# Patient Record
Sex: Female | Born: 1995 | Race: White | Hispanic: No | Marital: Single | Smoking: Never smoker
Health system: Southern US, Community
[De-identification: ages and names within clinical notes are randomized; demographics above are authoritative.]

## PROBLEM LIST (undated history)

## (undated) DIAGNOSIS — F32A Depression, unspecified: Secondary | ICD-10-CM

## (undated) DIAGNOSIS — T7840XA Allergy, unspecified, initial encounter: Secondary | ICD-10-CM

## (undated) DIAGNOSIS — F329 Major depressive disorder, single episode, unspecified: Secondary | ICD-10-CM

## (undated) DIAGNOSIS — F419 Anxiety disorder, unspecified: Secondary | ICD-10-CM

## (undated) DIAGNOSIS — A749 Chlamydial infection, unspecified: Secondary | ICD-10-CM

## (undated) HISTORY — DX: Chlamydial infection, unspecified: A74.9

## (undated) HISTORY — DX: Anxiety disorder, unspecified: F41.9

## (undated) HISTORY — DX: Allergy, unspecified, initial encounter: T78.40XA

## (undated) HISTORY — DX: Depression, unspecified: F32.A

## (undated) HISTORY — DX: Major depressive disorder, single episode, unspecified: F32.9

## (undated) HISTORY — PX: WISDOM TOOTH EXTRACTION: SHX21

---

## 2013-01-07 ENCOUNTER — Telehealth: Payer: Self-pay | Admitting: Nurse Practitioner

## 2013-01-07 MED ORDER — TERCONAZOLE 0.4 % VA CREA: 1.0000 | TOPICAL_CREAM | Freq: Every day | VAGINAL | Status: DC

## 2013-01-07 NOTE — Telephone Encounter (Signed)
LEFT MESSAGE ON CB# ON VOICE MAIL OF MEDICATION SENT TO [facility]. WILL NEED TO SCHEDULE OV IF NO IMPROVEMENT.

## 2013-01-07 NOTE — Telephone Encounter (Signed)
OK for Terazol Vaginal cream HS X 7.  Then if no better OV.

## 2013-01-07 NOTE — Telephone Encounter (Signed)
SEE NOTE BELOW. PATIENT WORKING 2 JOBS. HAS HISTORY OF YEAST PROBLEM. STATES LAST TIME DIFLUCON HELPED SOME BUT CAME RIGHT BACK AND HAS BEEN DEALING WITH THIS. EXPLAINED AND SUGGESTED OTC MEDS CAN TRY, AVEENO BATH, COTTON CLOTHING. PLEASE ADVISE. STATES ? VAGINAL CREAM OF Rx HELPED LAST TIME. sue

## 2013-01-07 NOTE — Telephone Encounter (Signed)
Called patient at CB# of medication sent to [facility] per Shirlyn Goltz ordered. Unable to leave message at CB# mailbox was full.

## 2013-01-07 NOTE — Telephone Encounter (Signed)
Patient is sure that she has a yeast infection and cannot come to an appointment today. Patient is asking if she could have a prescription called to her pharmacy [facility] [address]

## 2013-01-31 ENCOUNTER — Encounter: Payer: Self-pay | Admitting: Nurse Practitioner

## 2013-01-31 ENCOUNTER — Ambulatory Visit (INDEPENDENT_AMBULATORY_CARE_PROVIDER_SITE_OTHER): Payer: Managed Care, Other (non HMO) | Admitting: Nurse Practitioner

## 2013-01-31 VITALS — BP 104/60 | HR 68 | Resp 12 | Ht 63.5 in | Wt 153.2 lb

## 2013-01-31 DIAGNOSIS — R3915 Urgency of urination: Secondary | ICD-10-CM

## 2013-01-31 DIAGNOSIS — N898 Other specified noninflammatory disorders of vagina: Secondary | ICD-10-CM

## 2013-01-31 DIAGNOSIS — B9689 Other specified bacterial agents as the cause of diseases classified elsewhere: Secondary | ICD-10-CM

## 2013-01-31 DIAGNOSIS — A499 Bacterial infection, unspecified: Secondary | ICD-10-CM

## 2013-01-31 DIAGNOSIS — N760 Acute vaginitis: Secondary | ICD-10-CM

## 2013-01-31 DIAGNOSIS — Z113 Encounter for screening for infections with a predominantly sexual mode of transmission: Secondary | ICD-10-CM

## 2013-01-31 DIAGNOSIS — L293 Anogenital pruritus, unspecified: Secondary | ICD-10-CM

## 2013-01-31 MED ORDER — METRONIDAZOLE 0.75 % VA GEL: 1.0000 | Freq: Every day | VAGINAL | Status: DC

## 2013-01-31 MED ORDER — NYSTATIN-TRIAMCINOLONE 100000-0.1 UNIT/GM-% EX OINT: TOPICAL_OINTMENT | Freq: Two times a day (BID) | CUTANEOUS | Status: AC

## 2013-01-31 MED ORDER — FLUCONAZOLE 150 MG PO TABS: 150.0000 mg | ORAL_TABLET | Freq: Once | ORAL | Status: DC

## 2013-01-31 NOTE — Patient Instructions (Addendum)
Sexually Transmitted Disease  Sexually transmitted disease (STD) refers to any infection that is passed from person to person during sexual activity. This may happen by way of saliva, semen, blood, vaginal mucus, or urine. Common STDs include:   Gonorrhea.   Chlamydia.   Syphilis.   HIV/AIDS.   Genital herpes.   Hepatitis B and C.   Trichomonas.   Human papillomavirus (HPV).   Pubic lice.  CAUSES   An STD may be spread by bacteria, virus, or parasite. A person can get an STD by:   Sexual intercourse with an infected person.   Sharing sex toys with an infected person.   Sharing needles with an infected person.   Having intimate contact with the genitals, mouth, or rectal areas of an infected person.  SYMPTOMS   Some people may not have any symptoms, but they can still pass the infection to others. Different STDs have different symptoms. Symptoms include:   Painful or bloody urination.   Pain in the pelvis, abdomen, vagina, anus, throat, or eyes.   Skin rash, itching, irritation, growths, or sores (lesions). These usually occur in the genital or anal area.   Abnormal vaginal discharge.   Penile discharge in men.   Soft, flesh-colored skin growths in the genital or anal area.   Fever.   Pain or bleeding during sexual intercourse.   Swollen glands in the groin area.   Yellow skin and eyes (jaundice). This is seen with hepatitis.  DIAGNOSIS   To make a diagnosis, your caregiver may:   Take a medical history.   Perform a physical exam.   Take a specimen (culture) to be examined.   Examine a sample of discharge under a microscope.   Perform blood tests.   Perform a Pap test, if this applies.   Perform a colposcopy.   Perform a laparoscopy.  TREATMENT    Chlamydia, gonorrhea, trichomonas, and syphilis can be cured with antibiotic medicine.   Genital herpes, hepatitis, and HIV can be treated, but not cured, with prescribed medicines. The medicines will lessen the symptoms.   Genital warts  from HPV can be treated with medicine or by freezing, burning (electrocautery), or surgery. Warts may come back.   HPV is a virus and cannot be cured with medicine or surgery.However, abnormal areas may be followed very closely by your caregiver and may be removed from the cervix, vagina, or vulva through office procedures or surgery.  If your diagnosis is confirmed, your recent sexual partners need treatment. This is true even if they are symptom-free or have a negative culture or evaluation. They should not have sex until their caregiver says it is okay.  HOME CARE INSTRUCTIONS   All sexual partners should be informed, tested, and treated for all STDs.   Take your antibiotics as directed. Finish them even if you start to feel better.   Only take over-the-counter or prescription medicines for pain, discomfort, or fever as directed by your caregiver.   Rest.   Eat a balanced diet and drink enough fluids to keep your urine clear or pale yellow.   Do not have sex until treatment is completed and you have followed up with your caregiver. STDs should be checked after treatment.   Keep all follow-up appointments, Pap tests, and blood tests as directed by your caregiver.   Only use latex condoms and water-soluble lubricants during sexual activity. Do not use petroleum jelly or oils.   Avoid alcohol and illegal drugs.   Get vaccinated   for HPV and hepatitis. If you have not received these vaccines in the past, talk to your caregiver about whether one or both might be right for you.   Avoid risky sex practices that can break the skin.  The only way to avoid getting an STD is to avoid all sexual activity.Latex condoms and dental dams (for oral sex) will help lessen the risk of getting an STD, but will not completely eliminate the risk.  SEEK MEDICAL CARE IF:    You have a fever.   You have any new or worsening symptoms.  Document Released: 11/04/2002 Document Revised: 11/06/2011 Document Reviewed:  11/11/2010  [employer] Patient Information 2014 [employer].

## 2013-01-31 NOTE — Progress Notes (Signed)
Subjective:     Patient ID: Laura Odom, female   DOB: 06-18-1996, 17 y.o.   MRN: [id number]  HPI 17 yo WF complains of vaginal itching on and off for months.  She is totally frustrated with symptoms and is unhappy with her treatment.  She feels that we are not listening to her and she felt like the last time I treated her she did not get better.  At last visit in February she had yeast vaginitis symptoms  and was treated with Diflucan weekly X 4 which I explained to her was appropriate treatment for chronic yeast.  She then was given triamcinolone and Nystatin  for external itching which she never got filled.  She states our GC Chl testing was negative but [facility] and was positive for chlamydia.  In the interim she has been with 2 partners.  So tried to tell her this could have been a new infection.  She wants retesting today but also treated again for Chlamydia instead of waiting for test results. I explained that she just got treatment mid May and to treat again without culture results was not advised. After treatment for Chlamydia last month has also finished 7 day of Terazol vaginal cream.  Currently using condoms for birth control.  Review of Systems  Constitutional: Negative for fever, chills, appetite change and fatigue.  Respiratory: Negative.   Cardiovascular: Negative.   Gastrointestinal: Negative.  Negative for nausea, vomiting, abdominal pain, diarrhea, constipation and blood in stool.  Genitourinary: Positive for dysuria, frequency, vaginal discharge and dyspareunia. Negative for urgency, hematuria, flank pain, vaginal bleeding, difficulty urinating, genital sores, vaginal pain and pelvic pain.       LMP 01/09/18 with regular flow. Off OCP at present.  She had been noncompliant to OCP at last visit.  Now wants to try POP. Some urinary urgency with maybe slight dysuria for a few days.    Neurological: Negative.   Psychiatric/Behavioral: Positive for  agitation. The patient is nervous/anxious and is hyperactive.        Objective:   Physical Exam  Constitutional: She is oriented to person, place, and time. She appears well-developed and well-nourished.  Cardiovascular: Normal rate.   Pulmonary/Chest: Effort normal.  Abdominal: Soft. She exhibits no distension and no mass. There is no tenderness. There is no rebound and no guarding.  Genitourinary:  No evidence of external rash or lesions.  No signs of chronic yeast. Thin grey vaginal discharge.  Specimen for GC / Chl are obtained. No pelvic pain or bladder pain on bimanual.  Wet prep is + for clue cells of NSS, no yeast on KOH, ph 5.0.  Neurological: She is alert and oriented to person, place, and time.  Skin: Skin is warm and dry.  Psychiatric:  Crying tearful and angry most of visit.       Assessment:     History of chronic yeast per patient Currently BV R/O STD's Need for birth control - currently condoms    Plan:     Metrogel vaginal cream hs X 5 Back up Diflucan if needed X 2 Will call her with STD results - declined pretreatment Since Ms. Laura Odom did AEX in January will have patient to return and consult about birth control options  She has to go to work at 5:00 and with patient's frustration/tearfulness, feel best for her to return on another day.

## 2013-02-03 ENCOUNTER — Telehealth: Payer: Self-pay | Admitting: *Deleted

## 2013-02-03 DIAGNOSIS — N390 Urinary tract infection, site not specified: Secondary | ICD-10-CM

## 2013-02-03 MED ORDER — CIPROFLOXACIN HCL 500 MG PO TABS: 500.0000 mg | ORAL_TABLET | Freq: Two times a day (BID) | ORAL | Status: DC

## 2013-02-03 NOTE — Telephone Encounter (Signed)
Message copied by Osie Bond on Mon Feb 03, 2013  1:45 PM ------      Message from: Ria Comment R      Created: Mon Feb 03, 2013 12:51 PM       Let patient know that urine culture was positive and she needs Cipro 500 mg bid for 1 week. Then TOC in 2 weeks ------

## 2013-02-03 NOTE — Telephone Encounter (Signed)
Cipro 500 mg !po BID #14/0 [facility] per pt's request. pt is aware. TOC on 02/17/2013 @ 3:00, pt aware.

## 2013-02-03 NOTE — Telephone Encounter (Signed)
LVM for pt to return my call in regards to urine culture results.  

## 2013-02-05 LAB — IPS N GONORRHOEA AND CHLAMYDIA BY PCR

## 2013-02-06 ENCOUNTER — Telehealth: Payer: Self-pay | Admitting: *Deleted

## 2013-02-06 ENCOUNTER — Encounter: Payer: Self-pay | Admitting: Nurse Practitioner

## 2013-02-06 NOTE — Telephone Encounter (Signed)
LVM for pt to return my call in regards to std results.

## 2013-02-06 NOTE — Telephone Encounter (Signed)
Message copied by Osie Bond on Thu Feb 06, 2013 10:58 AM ------      Message from: Ria Comment R      Created: Wed Feb 05, 2013  6:11 PM       Let patient know ------

## 2013-02-06 NOTE — Progress Notes (Signed)
Reviewed personally.  M. Suzanne Savir Blanke, MD.  

## 2013-02-06 NOTE — Progress Notes (Signed)
Pt is aware of all negative lab results.

## 2013-02-06 NOTE — Telephone Encounter (Signed)
Pt is aware of all negative lab results.  

## 2013-02-06 NOTE — Telephone Encounter (Signed)
Message copied by Osie Bond on Thu Feb 06, 2013 10:53 AM ------      Message from: Ria Comment R      Created: Wed Feb 05, 2013  6:11 PM       Let patient know ------

## 2013-02-17 ENCOUNTER — Other Ambulatory Visit: Payer: Self-pay

## 2013-02-17 ENCOUNTER — Telehealth: Payer: Self-pay | Admitting: Nurse Practitioner

## 2013-02-17 NOTE — Telephone Encounter (Signed)
Pt missed lab appt for toc this afternoon. Left message to call back if she wish to reschedule.

## 2013-02-19 ENCOUNTER — Telehealth: Payer: Self-pay | Admitting: *Deleted

## 2013-02-19 NOTE — Telephone Encounter (Signed)
Pt was given Rx for Cipro 500 mg 1 po BID #14/0 refills on 02/03/2013. Pt states she picked up Rx from pharmacy on 02/05/2013 and started taking Rx the morning of 02/06/2013. Pt states she lost the last three days worth of Cipro Rx. (pt should have completed Rx on 02/13/2013.) Today is 02/19/2013. Pt states she is having symptoms again and would like another Rx of Cipro called into pharmacy. I told pt office policy states that she has to be seen and antibiotic can't be called into pharmacy without an office visit. Pt insisted that "money's tight right now" and she wouldn't be able to come in office. Please advise.

## 2013-02-19 NOTE — Telephone Encounter (Signed)
Patient needs to come in due to symptoms better and now have started again, especially with a UTI

## 2013-03-26 ENCOUNTER — Ambulatory Visit (INDEPENDENT_AMBULATORY_CARE_PROVIDER_SITE_OTHER): Payer: Managed Care, Other (non HMO) | Admitting: Gynecology

## 2013-03-26 ENCOUNTER — Encounter: Payer: Self-pay | Admitting: Gynecology

## 2013-03-26 VITALS — BP 90/60 | HR 64 | Temp 98.1°F | Ht 63.5 in | Wt 146.0 lb

## 2013-03-26 DIAGNOSIS — N760 Acute vaginitis: Secondary | ICD-10-CM

## 2013-03-26 LAB — POCT WET PREP (WET MOUNT)

## 2013-03-26 NOTE — Progress Notes (Signed)
  Subjective:    Laura Odom is a 17 y.o. female who presents for sexually transmitted disease check. Sexual history reviewed with the patient. STI Exposure: recent partner had +chlamydia pt was treated and had a negative culture afterwards.  Pt states that she is not with the partner that gave her chlamydia, but has a new partner for the last month.  Pt reports using condoms with this new partner.  Previous history of STI chlamydia. Current symptoms vaginal irritation: mild, abnormal bleeding and fever denied.  Pt recently diagnosed with UTI, did not take cipro twice a day as prescribed but took sporadically.  Pt would like test of cure, she left clean catch. Contraception: condoms Menstrual History: OB History   Grav Para Term Preterm Abortions TAB SAB Ect Mult Living                   Patient's last menstrual period was 03/08/2013.    The following portions of the patient's history were reviewed and updated as appropriate: allergies, current medications, past family history, past medical history, past social history, past surgical history and problem list.  Review of Systems Pertinent items are noted in HPI.    Objective:    BP 90/60  Pulse 64  Temp(Src) 98.1 F (36.7 C) (Oral)  Ht 5' 3.5" (1.613 m)  Wt 146 lb (66.225 kg)  BMI 25.45 kg/m2  LMP 03/08/2013 General:   alert, cooperative and appears stated age  Lymph Nodes:   Inguinal adenopathy: absent  Pelvis:  External genitalia: normal general appearance Urinary system: urethral meatus normal Vaginal: normal without tenderness, induration or masses Cervix: normal appearance Adnexa: normal bimanual exam Uterus: normal single, nontender  Cultures:  urine culture and wet prep     Assessment:    Very low risk of STD exposure    Plan:    Discussed safe sexual practice in detail Discussed HSV issues in detail. See orders. Recommend using A&D ointment to protect perineum, pt is using either dial soap or summer's eve,  recommend cetaphil

## 2013-03-26 NOTE — Patient Instructions (Signed)
  cetaphil liquid on hand to wash vaginal area, avoid harsh soaps, thongs  Can use A&D ointment as needed to protect area Condoms!!

## 2013-03-27 LAB — URINE CULTURE

## 2013-04-02 LAB — HSV 1 AND 2 IGM ABS, INDIRECT: HSV 1 IgM Abs: NEGATIVE

## 2013-04-03 ENCOUNTER — Telehealth: Payer: Self-pay | Admitting: *Deleted

## 2013-04-03 NOTE — Telephone Encounter (Signed)
Message copied by Lorraine Lax on Thu Apr 03, 2013  1:19 PM ------      Message from: Douglass Rivers      Created: Thu Apr 03, 2013  1:04 PM       Inform HSV titres neg and urine culture contaiminated without symptoms would not repeat ------

## 2013-04-03 NOTE — Telephone Encounter (Signed)
Left Message To Call Back Re: Results

## 2013-04-07 NOTE — Telephone Encounter (Signed)
Patient notified see labs 

## 2013-04-09 ENCOUNTER — Ambulatory Visit: Payer: Managed Care, Other (non HMO) | Admitting: Certified Nurse Midwife

## 2013-04-10 ENCOUNTER — Encounter: Payer: Self-pay | Admitting: Certified Nurse Midwife

## 2013-04-30 ENCOUNTER — Telehealth: Payer: Self-pay | Admitting: Gynecology

## 2013-04-30 NOTE — Telephone Encounter (Signed)
Call to patient who states that she does not feel she should have to pay co-pay since we did not test her previously for the things she requested.  Advised that on 03-26-13, she had a wet prep, test for yeast which was negative.  We attempted to do urine culture test of cure but results indicated contamination and since she did not have symptoms, it was not repeated.  If symptoms have returned now, we are happy to see her and recommend she be seen but co-pay would apply.  Patient declines appointment.

## 2013-04-30 NOTE — Telephone Encounter (Signed)
Patient wants to come in today to have her urine checked. Patient also thinks she has a yeast infection. Patient said she was here recently and was told it was not necessary to be checked for yeast. Patient went to an urgent care and was told yeast was present. Patient doesn't feel she should have to pay a copay for an appointment today due to the fact she asked to be checked for yeast at her last appointment. Patient would like an appointment today. I told patient she probably would have an office visit charge if she sees the Doctor.

## 2013-08-04 ENCOUNTER — Other Ambulatory Visit: Payer: Self-pay | Admitting: Family Medicine

## 2013-08-04 DIAGNOSIS — IMO0002 Reserved for concepts with insufficient information to code with codable children: Secondary | ICD-10-CM

## 2013-08-04 DIAGNOSIS — R102 Pelvic and perineal pain: Secondary | ICD-10-CM

## 2013-08-06 ENCOUNTER — Ambulatory Visit
Admission: RE | Admit: 2013-08-06 | Discharge: 2013-08-06 | Disposition: A | Discharge status: Home / Self Care | Payer: Managed Care, Other (non HMO) | Source: Ambulatory Visit | Attending: Family Medicine | Admitting: Family Medicine

## 2013-08-06 DIAGNOSIS — R102 Pelvic and perineal pain: Secondary | ICD-10-CM

## 2013-08-06 DIAGNOSIS — IMO0002 Reserved for concepts with insufficient information to code with codable children: Secondary | ICD-10-CM

## 2013-08-19 ENCOUNTER — Other Ambulatory Visit: Payer: Self-pay | Admitting: Family Medicine

## 2013-08-19 DIAGNOSIS — Z8271 Family history of polycystic kidney: Secondary | ICD-10-CM

## 2013-08-28 DIAGNOSIS — A749 Chlamydial infection, unspecified: Secondary | ICD-10-CM

## 2013-08-28 HISTORY — DX: Chlamydial infection, unspecified: A74.9

## 2013-08-29 ENCOUNTER — Ambulatory Visit
Admission: RE | Admit: 2013-08-29 | Discharge: 2013-08-29 | Disposition: A | Discharge status: Home / Self Care | Payer: Managed Care, Other (non HMO) | Source: Ambulatory Visit | Attending: Family Medicine | Admitting: Family Medicine

## 2013-08-29 DIAGNOSIS — Z8271 Family history of polycystic kidney: Secondary | ICD-10-CM

## 2014-04-23 ENCOUNTER — Ambulatory Visit (INDEPENDENT_AMBULATORY_CARE_PROVIDER_SITE_OTHER): Payer: Managed Care, Other (non HMO) | Admitting: Family Medicine

## 2014-04-23 VITALS — BP 94/58 | HR 78 | Temp 98.8°F | Resp 16 | Ht 63.0 in | Wt 140.2 lb

## 2014-04-23 DIAGNOSIS — Z3009 Encounter for other general counseling and advice on contraception: Secondary | ICD-10-CM

## 2014-04-23 DIAGNOSIS — Z202 Contact with and (suspected) exposure to infections with a predominantly sexual mode of transmission: Secondary | ICD-10-CM

## 2014-04-23 DIAGNOSIS — N898 Other specified noninflammatory disorders of vagina: Secondary | ICD-10-CM

## 2014-04-23 LAB — POCT WET PREP WITH KOH
KOH PREP POC: NEGATIVE
Trichomonas, UA: NEGATIVE
YEAST WET PREP PER HPF POC: NEGATIVE

## 2014-04-23 MED ORDER — CEFTRIAXONE SODIUM 1 G IJ SOLR
500.0000 mg | Freq: Once | INTRAMUSCULAR | Status: AC
Start: 2014-04-23 — End: 2014-04-23
  Administered 2014-04-23: 500 mg via INTRAMUSCULAR

## 2014-04-23 MED ORDER — AZITHROMYCIN 250 MG PO TABS: ORAL_TABLET | ORAL | Status: DC

## 2014-04-23 MED ORDER — FLUCONAZOLE 150 MG PO TABS: 150.0000 mg | ORAL_TABLET | Freq: Once | ORAL | Status: AC

## 2014-04-23 NOTE — Progress Notes (Signed)
This chart was scribed for Laura Chick, MD by Laura Odom, ED Scribe. This patient was seen in room 2 and the patient's care was started at 9:10 PM.  Subjective:    Patient ID: Laura Odom, female    DOB: Sep 12, 1995, 18 y.o.   MRN: [id number]  04/23/2014  exspoure to chlamydia   HPI Laura Odom is a 18 y.o. female with a hx of anxiety and depression.   Pt states that she was told by her boyfriend that he was recently diagnosed with chlamydya. She is currently complaining of "clumpy" yellow discharge and worsening pelvic pain.  She also reports really bad vaginal itching.  There is a prior history of Chlamydia, last diagnosis was in January 2015. She states that she has been with her now ex-boyfriend for 2 months. Pt takes no form of birth control, she states that she has been prescribed the pills but has a hard time remembering to take them daily. Denies any surgeries, medicinal allergies, taking daily medication, fever, chills, nausea, emesis, or SOB.   Pt declined HIV testing. She states that she has been screened for it in the past 6 months.   Review of Systems  Constitutional: Negative for chills, diaphoresis, appetite change and fatigue.  HENT: Negative for congestion, ear discharge and sinus pressure.   Eyes: Negative for discharge.  Respiratory: Negative for cough.   Cardiovascular: Negative for chest pain.  Gastrointestinal: Negative for nausea, vomiting, abdominal pain and diarrhea.  Genitourinary: Positive for dysuria, vaginal discharge and pelvic pain. Negative for urgency, frequency, hematuria, vaginal bleeding and genital sores.  Musculoskeletal: Negative for back pain.  Skin: Negative for rash.  Neurological: Negative for seizures and headaches.  Psychiatric/Behavioral: Negative for hallucinations.    Past Medical History  Diagnosis Date  . Allergy   . Anxiety   . Depression   . Chlamydia 08/28/2013   History reviewed. No pertinent past surgical history. No  Known Allergies Current Outpatient Prescriptions  Medication Sig Dispense Refill  . azithromycin (ZITHROMAX) 250 MG tablet 4 tablets po x 1  4 tablet  0  . fluconazole (DIFLUCAN) 150 MG tablet Take 1 tablet (150 mg total) by mouth once. Repeat if needed  2 tablet  0  . nystatin-triamcinolone ointment (MYCOLOG) Apply topically 2 (two) times daily.  30 g  0   No current facility-administered medications for this visit.   History   Social History  . Marital Status: Single    Spouse Name: N/A    Number of Children: N/A  . Years of Education: N/A   Occupational History  . Not on file.   Social History Main Topics  . Smoking status: Never Smoker   . Smokeless tobacco: Never Used  . Alcohol Use: No  . Drug Use: 2.00 per week    Special: Marijuana  . Sexual Activity: Yes    Partners: Male    Birth Control/ Protection: None   Other Topics Concern  . Not on file   Social History Narrative  . No narrative on file       Objective:    Triage Vitals:BP 94/58  Pulse 78  Temp(Src) 98.8 F (37.1 C) (Oral)  Resp 16  Ht  (1.6 m)  Wt 140 lb 4 oz (63.617 kg)  BMI 24.85 kg/m2  SpO2 98%  LMP 04/03/2014  Physical Exam  Nursing note and vitals reviewed. Constitutional: She appears well-developed and well-nourished. No distress.  HENT:  Head: Normocephalic and atraumatic.  Eyes: Conjunctivae are  normal. Right eye exhibits no discharge. Left eye exhibits no discharge.  Neck: Neck supple.  Cardiovascular: Normal rate, regular rhythm and normal heart sounds.  Exam reveals no gallop and no friction rub.   No murmur heard. Pulmonary/Chest: Effort normal and breath sounds normal. No respiratory distress.  Abdominal: Soft. She exhibits no distension and no mass. There is no tenderness. There is no rebound and no guarding.  Genitourinary: Uterus normal. There is no rash, tenderness or lesion on the right labia. There is no rash, tenderness or lesion on the left labia. Cervix exhibits  motion tenderness and discharge. Cervix exhibits no friability. No erythema, tenderness or bleeding around the vagina. No foreign body around the vagina. Vaginal discharge found.  Mild pelvic tenderness. White clumpy discharge noted but scant. Mild cervical motion tenderness.  Musculoskeletal: She exhibits no edema and no tenderness.  Neurological: She is alert.  Skin: Skin is warm and dry.  Psychiatric: She has a normal mood and affect. Her behavior is normal. Thought content normal.   Results for orders placed in visit on 04/23/14  POCT WET PREP WITH KOH      Result Value Ref Range   Trichomonas, UA Negative     Clue Cells Wet Prep HPF POC 0-2     Epithelial Wet Prep HPF POC 5-10     Yeast Wet Prep HPF POC neg     Bacteria Wet Prep HPF POC 1+     RBC Wet Prep HPF POC 0-2     WBC Wet Prep HPF POC 35-40     KOH Prep POC Negative     ROCEPHIN  IM ADMINiSTERED.    Assessment & Plan:   1. Vaginal discharge   2. Chlamydia contact   3. Encounter for counseling regarding contraception    1. Chlamydia exposure: New.  Obtain GC/Chlam; treat with Rocephin and Zithromax empirically. 2. Vaginal discharge: New.  With recent Chlamydia exposure.  Treat with Zithromax, Rocephin. 3. Contraception counseling: discussed various long acting contraception methods; has GYN who she sees regularly; highly recommend Nexplanon or Mirena IUD.  Meds ordered this encounter  Medications  . azithromycin (ZITHROMAX) 250 MG tablet    Sig: 4 tablets po x 1    Dispense:  4 tablet    Refill:  0  . cefTRIAXone (ROCEPHIN) injection 500 mg    Sig:     Order Specific Question:  Antibiotic Indication:    Answer:  STD  . fluconazole (DIFLUCAN) 150 MG tablet    Sig: Take 1 tablet (150 mg total) by mouth once. Repeat if needed    Dispense:  2 tablet    Refill:  0    No Follow-up on file.  I personally performed the services described in this documentation, which was scribed in my presence. The recorded  information has been reviewed and is accurate.  Nilda Simmer, M.D.  [facility] [address] [place], [place]  [address] [phone] phone [phone] fax

## 2014-04-25 LAB — GC/CHLAMYDIA PROBE AMP
CT PROBE, AMP APTIMA: NEGATIVE
GC Probe RNA: NEGATIVE

## 2014-04-26 ENCOUNTER — Telehealth: Payer: Self-pay

## 2014-04-26 MED ORDER — ONDANSETRON 4 MG PO TBDP: 4.0000 mg | ORAL_TABLET | Freq: Three times a day (TID) | ORAL | Status: AC | PRN

## 2014-04-26 MED ORDER — AZITHROMYCIN 250 MG PO TABS: ORAL_TABLET | ORAL | Status: AC

## 2014-04-26 NOTE — Telephone Encounter (Signed)
Patient threw up after taking the antibiotics, wants to know if it will have an effect on her treatment.   Okay to LMOVM - [phone]

## 2014-04-26 NOTE — Telephone Encounter (Signed)
Resent in rx with one zofran per Weber. LMOM letting her I know I sent this in and to take it with food.

## 2014-08-12 ENCOUNTER — Emergency Department (HOSPITAL_COMMUNITY)
Admission: EM | Admit: 2014-08-12 | Discharge: 2014-08-13 | Disposition: A | Discharge status: Home / Self Care | Payer: Managed Care, Other (non HMO) | Attending: Emergency Medicine | Admitting: Emergency Medicine

## 2014-08-12 ENCOUNTER — Encounter (HOSPITAL_COMMUNITY): Payer: Self-pay | Admitting: *Deleted

## 2014-08-12 DIAGNOSIS — R1012 Left upper quadrant pain: Secondary | ICD-10-CM | POA: Diagnosis not present

## 2014-08-12 DIAGNOSIS — Z8619 Personal history of other infectious and parasitic diseases: Secondary | ICD-10-CM | POA: Diagnosis not present

## 2014-08-12 DIAGNOSIS — Z79899 Other long term (current) drug therapy: Secondary | ICD-10-CM | POA: Diagnosis not present

## 2014-08-12 DIAGNOSIS — R1902 Left upper quadrant abdominal swelling, mass and lump: Secondary | ICD-10-CM | POA: Diagnosis present

## 2014-08-12 DIAGNOSIS — Z8659 Personal history of other mental and behavioral disorders: Secondary | ICD-10-CM | POA: Diagnosis not present

## 2014-08-12 MED ORDER — HYDROCODONE-ACETAMINOPHEN 5-325 MG PO TABS: 1.0000 | ORAL_TABLET | Freq: Four times a day (QID) | ORAL | Status: AC | PRN

## 2014-08-12 NOTE — ED Provider Notes (Signed)
CSN: [id number]     Arrival date & time 08/12/14  2246 History  This chart was scribed for non-physician practitioner, Mellody DrownLauren Basia Mcginty, PA-C, working with Suzi RootsKevin E Steinl, MD by Milly JakobJohn Lee Graves, ED Scribe. The patient was seen in room WTR6/WTR6. Patient's care was started at 11:21 PM.   Chief Complaint  Patient presents with  . Mass   HPI Comments: Laura Odom is a 18 y.o. female who presents to the Emergency Department complaining of a painful, swollen, mass under her left breast. She reports associated "cramping" in her chest today. She denies injury to the area. She denies a history of inflammation in this area.  She denies fever. She reports a history of allergies to many different foods and environmental factors. She denies any abdominal pain, nausea, vomiting, fever, or chills. 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    The history is provided by the patient. No language interpreter was used.   Past Medical History  Diagnosis Date  . Allergy   . Anxiety   . Depression   . Chlamydia 08/28/2013   No past surgical history on file. Family History  Problem Relation Age of Onset  . Diabetes Father   . Hyperlipidemia Father   . Cancer Maternal Grandmother   . Hyperlipidemia Maternal Grandfather   . Cancer Paternal Grandfather   . Diabetes Paternal Grandfather    History  Substance Use Topics  . Smoking status: Never Smoker   . Smokeless tobacco: Never Used  . Alcohol Use: No   OB History    No data available     Review of Systems  Constitutional: Negative for fever and chills.  Gastrointestinal: Negative for nausea, vomiting and abdominal pain.  Skin: Negative for color change, rash and wound.   Allergies  Review of patient's allergies indicates no known allergies.  Home Medications   Prior to Admission medications   Medication Sig Start Date End Date Taking? Authorizing Provider  azithromycin (ZITHROMAX) 250 MG tablet 4 tablets po x 1 04/26/14   Morrell RiddleSarah L Weber, PA-C   fluconazole (DIFLUCAN) 150 MG tablet Take 1 tablet (150 mg total) by mouth once. Repeat if needed 04/23/14   Ethelda ChickKristi M Smith, MD  nystatin-triamcinolone ointment [facility](MYCOLOG) Apply topically 2 (two) times daily. 01/31/13   Elease HashimotoPatricia Rolen-Grubb, FNP  ondansetron (ZOFRAN ODT) 4 MG disintegrating tablet Take 1 tablet (4 mg total) by mouth every 8 (eight) hours as needed for nausea or vomiting. 04/26/14   Morrell RiddleSarah L Weber, PA-C   Triage Vitals: BP 116/93 mmHg  Pulse 74  Temp(Src) 97.9 F (36.6 C) (Oral)  Resp 18  SpO2 100% Physical Exam  Constitutional: She is oriented to person, place, and time. She appears well-developed and well-nourished.  Non-toxic appearance. She does not have a sickly appearance. She does not appear ill. No distress.  HENT:  Head: Normocephalic and atraumatic.  Neck: Neck supple.  Pulmonary/Chest: Effort normal. No respiratory distress.  Abdominal: Soft. There is tenderness in the left upper quadrant. There is no rebound and no guarding.    Tenderness with palpation overlying the ribs and LUQ, small tender mass felt in the superficial tissues. No overlying erythema. No drainage. No obvious fluctuance.   Neurological: She is alert and oriented to person, place, and time.  Skin: Skin is warm and dry.  Psychiatric: She has a normal mood and affect. Her behavior is normal.  Nursing note and vitals reviewed.  ED Course  Procedures (including critical care time)  COORDINATION OF CARE: 11:26  PM-Discussed treatment plan which includes f/u with primary care and pain medication with pt at bedside and pt agreed to plan.   Labs Review Labs Reviewed - No data to display  Imaging Review No results found.   EKG Interpretation None      MDM   Final diagnoses:  Abdominal discomfort in left upper quadrant   Patient presents with mild swelling to left upper abdomen and overlying ribs with associated tenderness. No obvious sign of fluctuance and overlying erythema, drainage.  Unsure etiology of discomfort does not seem to be intra-abdominal blood in superficial tissues. Discussed with Dr. Denton LankSteinl all who also evaluated the patient, advises heat, pain control, return precautions. Discussed treatment plan with the patient. Return precautions given. Reports understanding and no other concerns at this time.  Patient is stable for discharge at this time.  Meds given in ED:  Medications - No data to display  New Prescriptions   HYDROCODONE-ACETAMINOPHEN (NORCO/VICODIN) 5-325 MG PER TABLET    Take 1 tablet by mouth every 6 (six) hours as needed.   I personally performed the services described in this documentation, which was scribed in my presence. The recorded information has been reviewed and is accurate.   Mellody DrownLauren Mikenzie Mccannon, PA-C 08/12/14 2351  Suzi RootsKevin E Steinl, MD 08/14/14 [phone]

## 2014-08-12 NOTE — ED Notes (Addendum)
Pt reports noting a painful mass under left breast/LUQ - pt denies fever - no redness or areas irritation noted on assessment.

## 2014-08-12 NOTE — Discharge Instructions (Signed)
Call for a follow up appointment with a Family or Primary Care Provider about the discomfort and left upper abdomen, left chest wall. Take ibuprofen over-the-counter for pain management, narcotic pain medication for breakthrough pain. Apply warm compresses 3-4 times a day. Return if Symptoms worsen, increase in swelling, fever, redness, drainage.   Take medication as prescribed.  Do not operate heavy machinery, drink alcohol with taking narcotic pain medication.   Emergency Department Resource Guide 1) Find a Doctor and Pay Out of Pocket Although you won't have to find out who is covered by your insurance plan, it is a good idea to ask around and get recommendations. You will then need to call the office and see if the doctor you have chosen will accept you as a new patient and what types of options they offer for patients who are self-pay. Some doctors offer discounts or will set up payment plans for their patients who do not have insurance, but you will need to ask so you aren't surprised when you get to your appointment.  2) Contact Your Local Health Department Not all health departments have doctors that can see patients for sick visits, but many do, so it is worth a call to see if yours does. If you don't know where your local health department is, you can check in your phone book. The CDC also has a tool to help you locate your state's health department, and many state websites also have listings of all of their local health departments.  3) Find a Walk-in Clinic If your illness is not likely to be very severe or complicated, you may want to try a walk in clinic. These are popping up all over the country in pharmacies, drugstores, and shopping centers. They're usually staffed by nurse practitioners or physician assistants that have been trained to treat common illnesses and complaints. They're usually fairly quick and inexpensive. However, if you have serious medical issues or chronic medical  problems, these are probably not your best option.  No Primary Care Doctor: - Call Health Connect at  [phone] - they can help you locate a primary care doctor that  accepts your insurance, provides certain services, etc. - Physician Referral Service- [phone]  Chronic Pain Problems: Organization         Address  Phone   Notes  [facility]  [phone]) [phone] Patients need to be referred by their primary care doctor.   Medication Assistance: Organization         Address  Phone   Notes  [facility]ication [facility] [address] [place], [place] [address] [phone] --Must be a resident of [facility] -- Must have NO insurance coverage whatsoever (no Medicaid/ Medicare, etc.) -- The pt. MUST have a primary care doctor that directs their care regularly and follows them in the community   MedAssist  [phone]   [employer]  [phone]    Agencies that provide inexpensive medical care: Organization         Address  Phone   Notes  [facility]  [phone]   [facility]    [phone]) [phone]   [facility] [address] [place], [place] [address] [phone]   [facility] [address], [place] [phone]) [phone]   [facility]anned [facility]    [phone](336) [phone]   [facility]    [phone]   [facility]  [address], [place] Phone:  [phone], Fax:  [phone] Hours of Operation:  9 am - 6 pm, M-F.  Also accepts Medicaid/Medicare and self-pay.  [facility]  [address], [place] Phone: [phone], Fax: [phone]. Hours of Operation:  8:30 am - 5:30 pm, M-F.  Also accepts Medicaid and self-pay.  [facility] [place] [address] [place] Phone: [phone]   [facility] [address] Natasha Bence [place], [place] [phone], Ext. [phone]  Mondays & Thursdays: 7-9 AM.  First 15 patients are seen on a first come, first serve basis.    Medicaid-accepting [facility] Providers:  Organization         Address  Phone   Notes  [facility] [address], [place] [phone] Also accepts self-pay patients.  [facility] [address] Laurell Josephs [place], [place]  [phone]   [facility] [address], [place] [phone]   [facility] [address], [place] [phone]   Renaye Rakers [address], [place]   [phone] Only accepts [place] patients after they have their name applied to their card.   Self-Pay (no insurance) in [facility]:  Organization         Address  Phone   Notes  Sickle Cell Patients, [facility] [address] [place], [place] [phone]   [facility] [address] [place], [place] [phone]   [facility] [place]  [address], [place] [phone]   [facility]/Dr. Osei-Bonsu  [address] [place] or [address], [place] [phone] Phone number for both [place] and [place] locations is the same.  [facility] [address] [place] [phone]   [facility] [address] [place] or [address] [phone]   [facility] [address], [place] [phone], phone; [phone], fax Sees patients 1st and 3rd Saturday of every month.  Must not qualify for public or private insurance (i.e. Medicaid, Medicare, [place] Health Choice, Veterans' Benefits)  Household income should be no more than 200% of the poverty level The clinic cannot treat you if you are pregnant or think you are pregnant  Sexually transmitted diseases are not treated at  the clinic.    Dental Care: Organization         Address  Phone  Notes  [facility] of [facility] [address] [place], [place] [phone] Accepts children up to age 51 who are enrolled in [place] or [place] Health Choice; pregnant women with a Medicaid card; and children who have applied for Medicaid or [place] Health Choice, but were declined, whose parents can pay a reduced fee at time of service.  [facility] Department of [facility]  [address], [place] [phone] Accepts children up to age 80 who are enrolled in [place] or [place] Health Choice; pregnant women with a Medicaid card; and children who have applied for Medicaid or [place] Health Choice, but were declined, whose parents can pay a reduced fee at time of service.  [facility]  [address] [place],  [place] [phone] Patients are seen by appointment only. Walk-ins are not accepted. [place] will see patients 79 years of age and older. Monday - Tuesday (8am-5pm) Most Wednesdays (8:30-5pm) $30 per visit, cash only  [facility]  [address], [facility] [phone] Patients are seen by appointment only. Walk-ins are not accepted. Center will see patients 50 years of age and older. One Wednesday Evening (Monthly: Volunteer Based).  $30 per visit, cash only  [place]  [phone] for adults; Children under age 22, call Graduate Pediatric Dentistry at [phone]. Children aged 59-14, please call [phone] to request a pediatric application.  Dental services are provided in all areas of dental care including fillings, crowns and bridges, complete and partial dentures, implants, gum treatment, root canals, and extractions. Preventive care is also provided. Treatment is provided to both adults and children. Patients are selected via a lottery and there is often a  waiting list.   [facility] [address], [place]  [phone] www.drcivils.com   [facility] [address] [place], [place] [phone], Ext. [phone] Second and Fourth Thursday of each month, opens at 6:30 AM; Clinic ends at 9 AM.  Patients are seen on a first-come first-served basis, and a limited number are seen during each clinic.   [facility]  [address] Hillard Danker [place], [place] [phone]   Eligibility Requirements You must have lived in [place], [place], or [place] counties for at least the last three months.   You cannot be eligible for state or federal sponsored [employer], including [employer], [place], or [employer].   You generally cannot be eligible for healthcare insurance through your employer.    How to apply: Eligibility screenings are held every Tuesday and Wednesday afternoon from 1:00 pm until 4:00 pm. You do not need an appointment for the interview!  [facility] [address] [place], [place]   [place]  [place] Department  [place]  [phone]    Behavioral Health Resources in the Community: Intensive Outpatient Programs Organization         Address  Phone  Notes  [place]. [address] [place], [place] [phone]   [facility] Outpatient [address], [place], [place]   ADS: Alcohol & Drug Svcs [address], [place], [place]   [place] [address],  [place], [place] or [phone]   Substance Abuse Resources Organization         Address  Phone  Notes  Alcohol and Drug Services  [phone]   [place]  [phone]   [facility] [place]   Chinita Pester  [phone]   Residential & Outpatient Substance Abuse  Program  [phone]   Psychological Services Organization         Address  Phone  Notes  [facility] [place]  [place]  [phone]   [place] [address], [place] or [phone]    Mobile Crisis Teams Organization         Address  Phone  Notes  [facility], Mobile Crisis Care Unit  [phone]   [facility]  [address] [place], [place]   [place] Levels [phone]  [address] [place] [phone]    Self-Help/Support Groups Organization         Address  Phone             Notes  Mental Health Assoc. of Congress - variety of support groups  336- [phone] Call for more information  Narcotics Anonymous (NA), [facility] [address], [place]  2 meetings at this location   Statisticianesidential Treatment Programs Organization         Address  Phone  Notes  [facility] [address],    [place]  [phone]   [facility]  [address], [place] [phone], [place], [place] [phone]   [facility] [address], [place] [phone] Admissions: 8am-3pm M-F  [facility] [address]    [place], [place] [phone]   [facility] [address], [place], [place] [phone]   [facility] [address]  [place], [place] [phone]   [facility] - Intensive Outpatient [address], [place], [place] [phone]   [facility] ([facility] [address]  [place], [place] [phone] or [phone]   [facility] (RT[facility]) [address] [place], [place] [phone] Accepts Medicaid  [facility] [address]  [place] [phone] Substance Abuse/Addiction Treatment   [facility] Behavioral Health Resources Organization          Address  Phone  Notes  [facility]  [phone]) [phone]   Angie FavaJulie Brannon, PhD [address] [place], [place]   [phone]) [phone] or [phone]   [facility]   [address] [place], [place] [phone](336) [phone]   [facility] [address], [place], [place] [phone]) [phone] Insurance/Medicaid/sponsorship through [facility]  [facility] Families [address]                                    [place], [place] [phone]) [phone] Therapy/tele-psych/case  [facility] [address]   [place], [place] [phone](336) [phone]    Dr. Lolly MustacheArfeen  [phone](336) [phone]   [facility]  [employer] [facility] 1) [address], [place] 2) [address], [place] 3)  [address], [place] [phone](336) [phone]) [phone]  [phone](336) [phone]   [facility] Child Abuse Hotline [phone](336) [phone] or [phone](336) [phone] (After Hours)

## 2014-08-13 NOTE — ED Notes (Signed)
Pt ambulating independently w/ steady gait on d/c in no acute distress, A&Ox4. Rx given x1  

## 2015-12-30 IMAGING — US US RENAL
1 series · 14 of 25 positions shown · non-contrast
Comparison: None.

CLINICAL DATA: Family history of polycystic kidney disease.

EXAM:
RENAL/URINARY TRACT ULTRASOUND COMPLETE

[Series 1: us renal · 0.26mm/px · 14 of 32 slices shown]
[im 1/32]
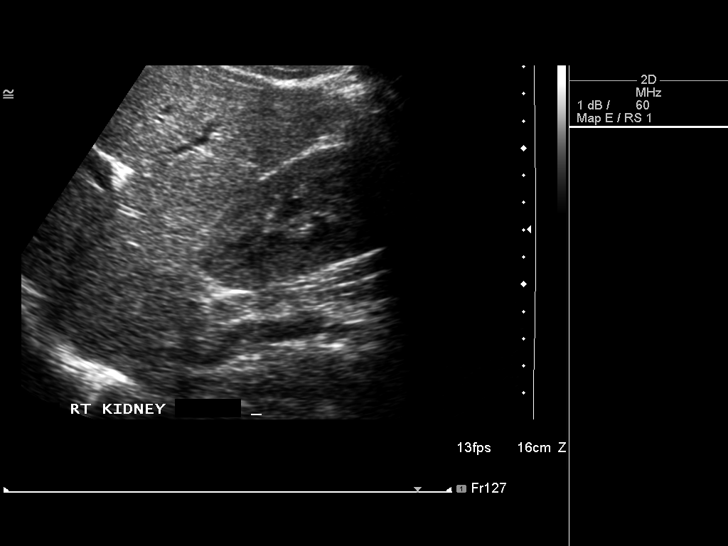
[im 3/32]
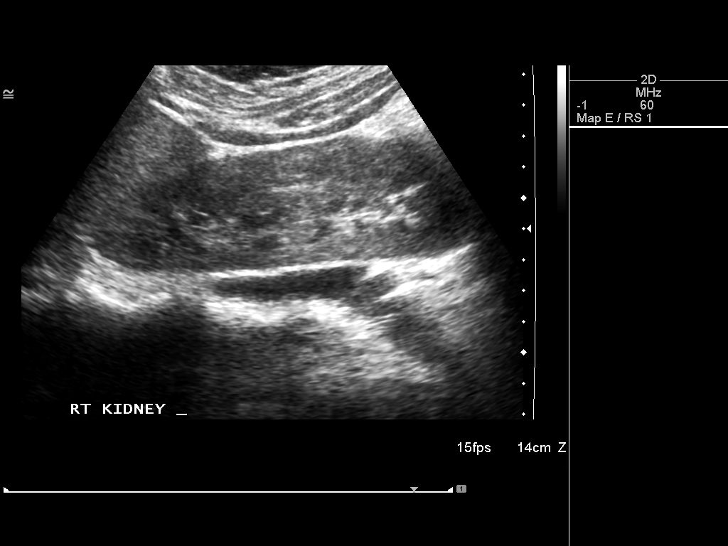
[im 6/32]
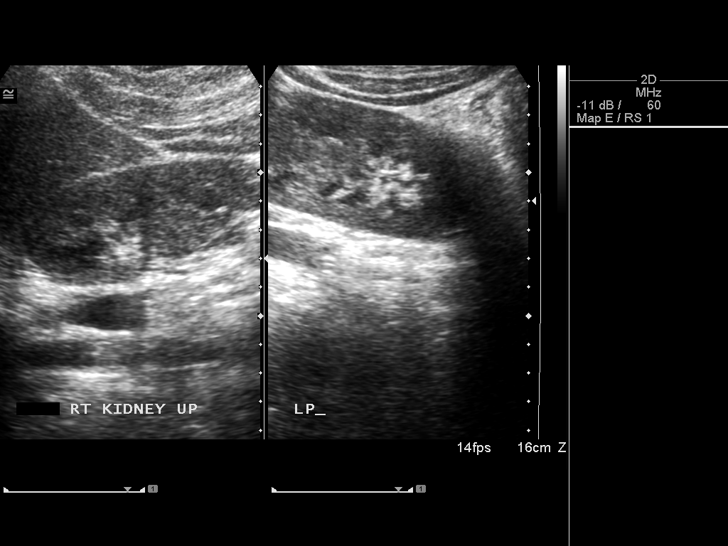
[im 8/32]
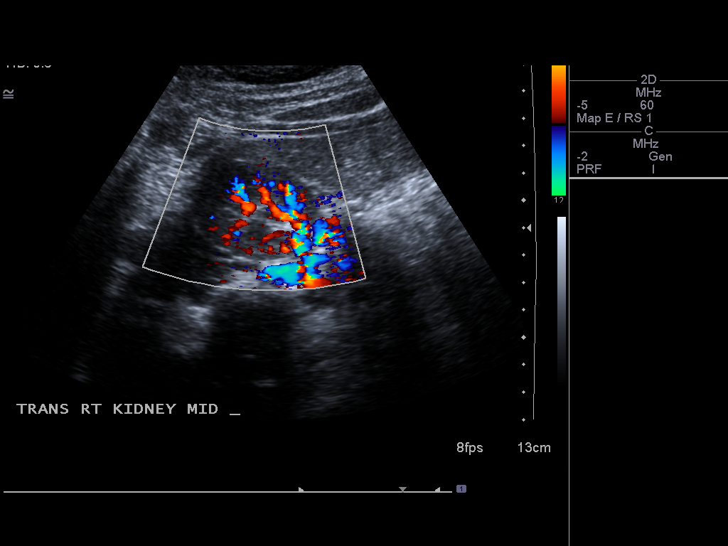
[im 11/32]
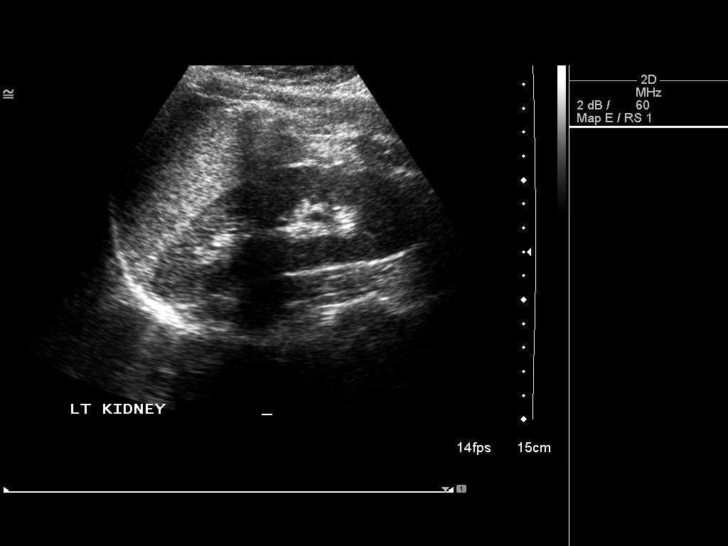
[im 12/32]
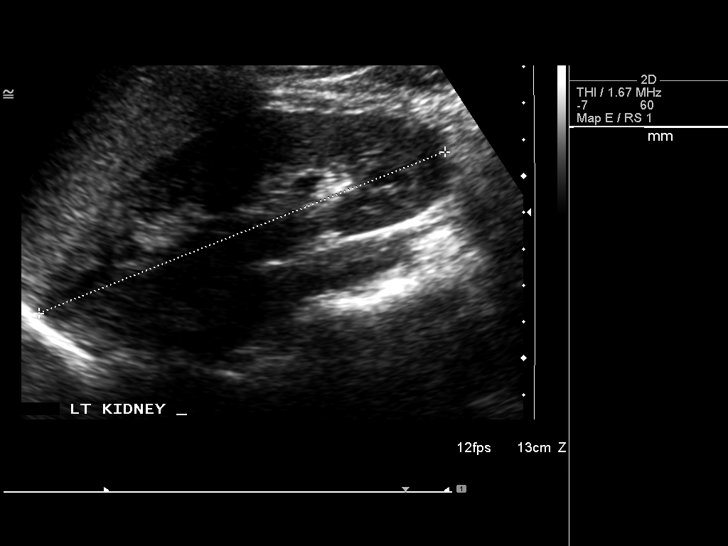
[im 15/32]
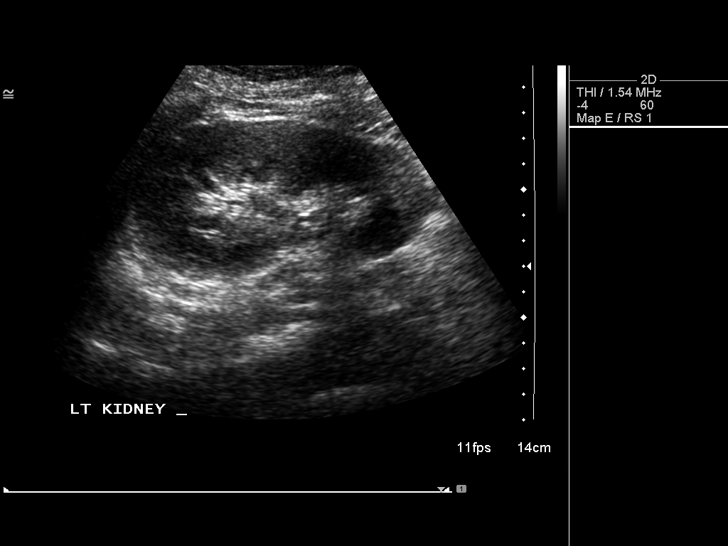
[im 17/32]
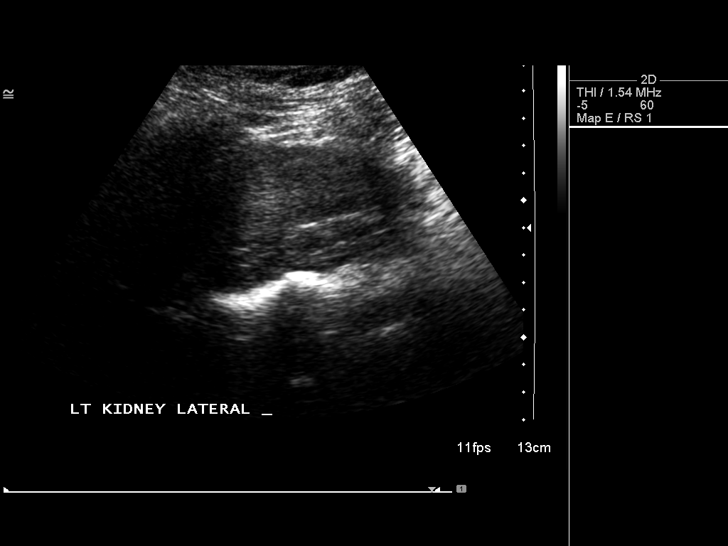
[im 20/32]
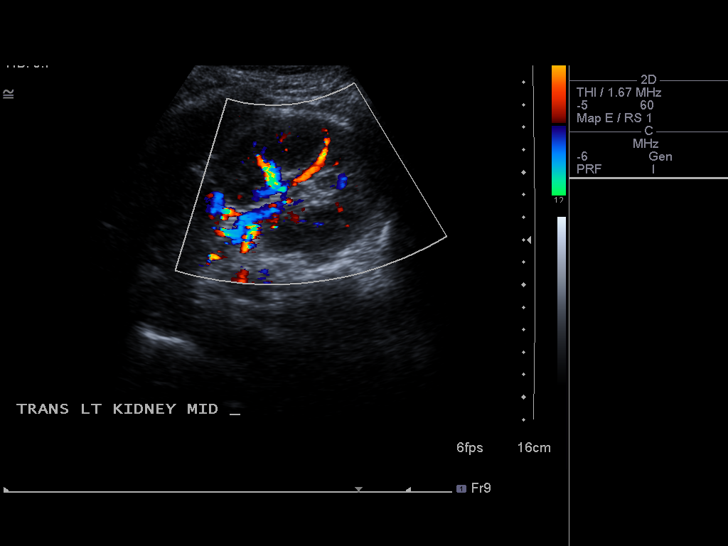
[im 21/32]
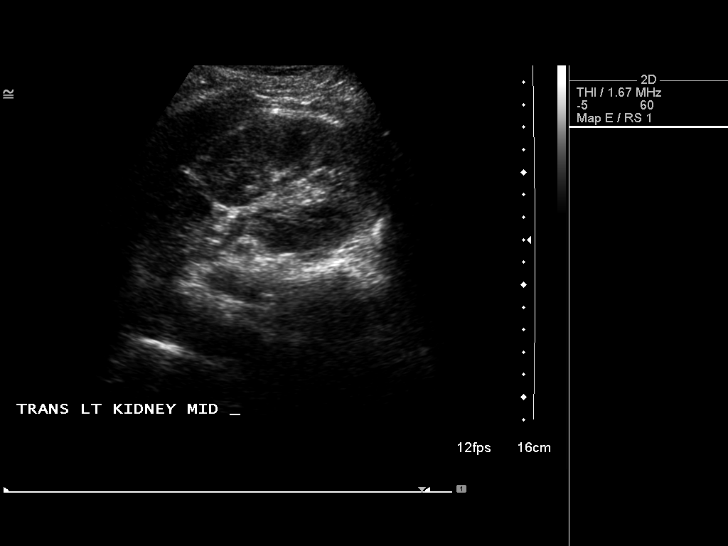
[im 24/32]
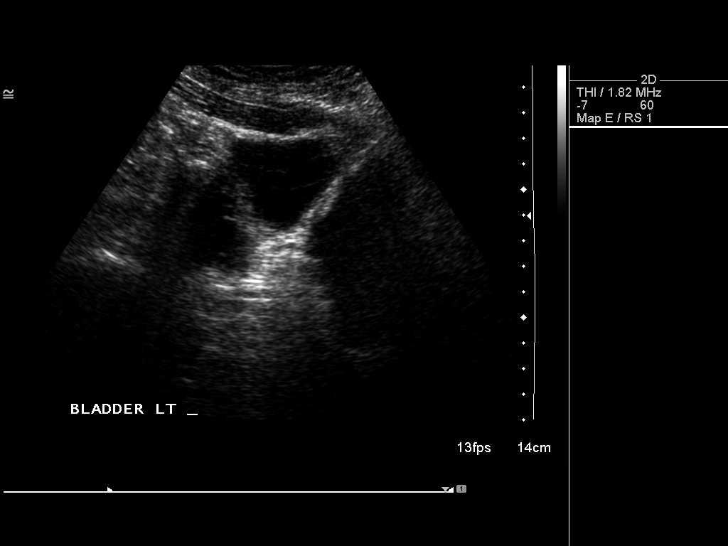
[im 26/32]
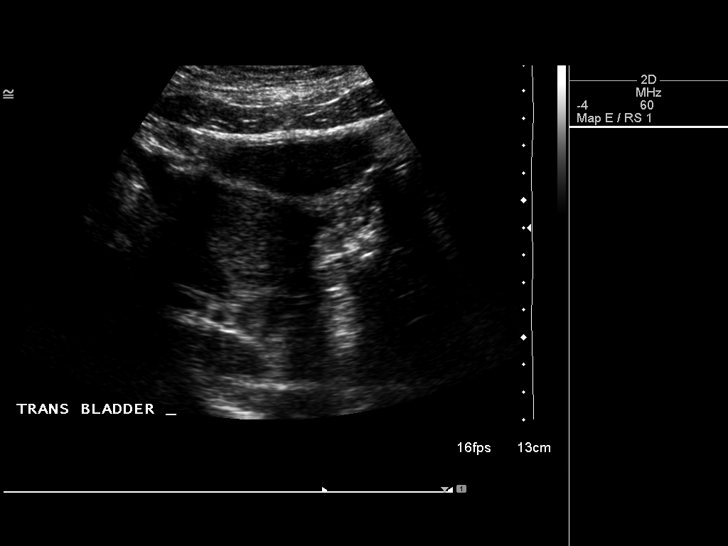
[im 29/32]
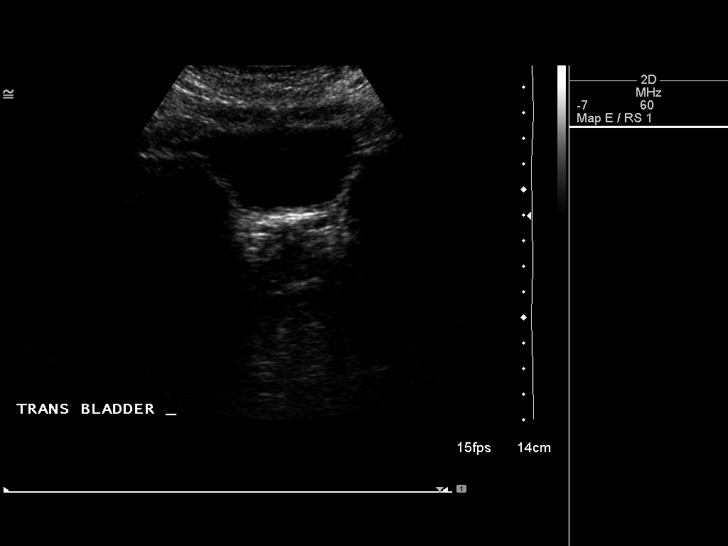
[im 32/32]
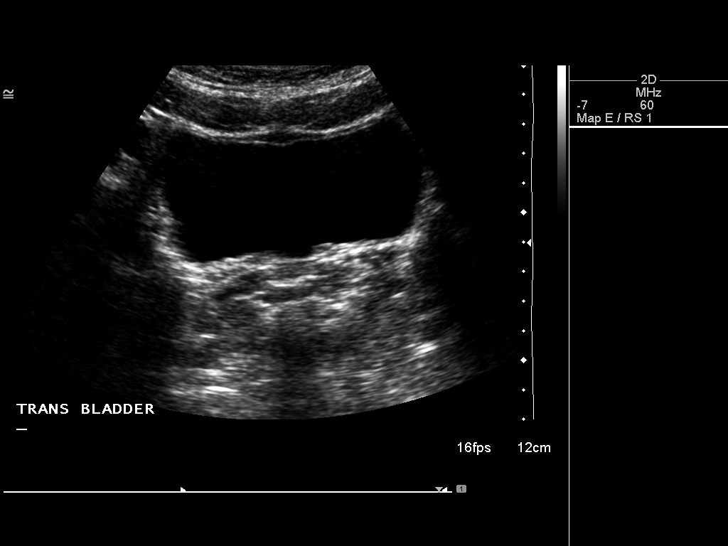

[14 of 25 positions shown; findings below may reference images not displayed]

FINDINGS: Right Kidney:

Length: 12.5 cm. Echogenicity within normal limits. No mass or
hydronephrosis visualized.

Left Kidney:

Length: 12 cm. Echogenicity within normal limits. No mass or
hydronephrosis visualized.

Bladder:

Appears normal for degree of bladder distention.
IMPRESSION: Normal renal ultrasound.

## 2019-08-04 ENCOUNTER — Emergency Department: Payer: Commercial Managed Care - POS

## 2019-08-04 ENCOUNTER — Emergency Department
Admission: EM | Admit: 2019-08-04 | Discharge: 2019-08-04 | Disposition: A | Discharge status: Home / Self Care | Payer: Commercial Managed Care - POS | Attending: Emergency Medicine | Admitting: Emergency Medicine

## 2019-08-04 DIAGNOSIS — N939 Abnormal uterine and vaginal bleeding, unspecified: Secondary | ICD-10-CM

## 2019-08-04 DIAGNOSIS — Z9889 Other specified postprocedural states: Secondary | ICD-10-CM

## 2019-08-04 DIAGNOSIS — O046 Delayed or excessive hemorrhage following (induced) termination of pregnancy: Secondary | ICD-10-CM | POA: Insufficient documentation

## 2019-08-04 DIAGNOSIS — Z2913 Encounter for prophylactic Rho(D) immune globulin: Secondary | ICD-10-CM

## 2019-08-04 LAB — GFR: EGFR: >60

## 2019-08-04 LAB — CBC AND DIFFERENTIAL
Absolute NRBC: 0 10*3/uL (ref 0.00–0.00)
Basophils Absolute Automated: 0.04 10*3/uL (ref 0.00–0.08)
Basophils Automated: 0.4 %
Eosinophils Absolute Automated: 0.26 10*3/uL (ref 0.00–0.44)
Eosinophils Automated: 2.5 %
Hematocrit: 40.5 % (ref 34.7–43.7)
Hgb: 13.2 g/dL (ref 11.4–14.8)
Immature Granulocytes Absolute: 0.03 10*3/uL (ref 0.00–0.07)
Immature Granulocytes: 0.3 %
Lymphocytes Absolute Automated: 3.65 10*3/uL — ABNORMAL HIGH (ref 0.42–3.22)
Lymphocytes Automated: 35.4 %
MCH: 29.5 pg (ref 25.1–33.5)
MCHC: 32.6 g/dL (ref 31.5–35.8)
MCV: 90.6 fL (ref 78.0–96.0)
MPV: 9.3 fL (ref 8.9–12.5)
Monocytes Absolute Automated: 0.82 10*3/uL (ref 0.21–0.85)
Monocytes: 7.9 %
Neutrophils Absolute: 5.52 10*3/uL (ref 1.10–6.33)
Neutrophils: 53.5 %
Nucleated RBC: 0 /100 WBC (ref 0.0–0.0)
Platelets: 355 10*3/uL — ABNORMAL HIGH (ref 142–346)
RBC: 4.47 10*6/uL (ref 3.90–5.10)
RDW: 18 % — ABNORMAL HIGH (ref 11–15)
WBC: 10.32 10*3/uL — ABNORMAL HIGH (ref 3.10–9.50)

## 2019-08-04 LAB — COMPREHENSIVE METABOLIC PANEL
ALT: 10 U/L (ref 0–55)
AST (SGOT): 13 U/L (ref 5–34)
Albumin/Globulin Ratio: 1.4 (ref 0.9–2.2)
Albumin: 4.6 g/dL (ref 3.5–5.0)
Alkaline Phosphatase: 41 U/L (ref 37–106)
Anion Gap: 15 (ref 5.0–15.0)
BUN: 9 mg/dL (ref 7–19)
Bilirubin, Total: <0.1 mg/dL — ABNORMAL LOW (ref 0.2–1.2)
CO2: 22 mEq/L (ref 22–29)
Calcium: 9.5 mg/dL (ref 8.5–10.5)
Chloride: 105 mEq/L (ref 100–111)
Creatinine: 0.7 mg/dL (ref 0.6–1.0)
Globulin: 3.3 g/dL (ref 2.0–3.6)
Glucose: 89 mg/dL (ref 70–100)
Potassium: 3.9 mEq/L (ref 3.5–5.1)
Protein, Total: 7.9 g/dL (ref 6.0–8.3)
Sodium: 142 mEq/L (ref 136–145)

## 2019-08-04 LAB — HCG QUANTITATIVE: hCG, Quant.: 1181.2

## 2019-08-04 LAB — RH IMM GLOBULIN

## 2019-08-04 LAB — ANTIBODY SCREEN: AB Screen Gel: NEGATIVE

## 2019-08-04 LAB — ABO/RH: ABO Rh: A NEG

## 2019-08-04 MED ORDER — METHYLERGONOVINE MALEATE 0.2 MG PO TABS
0.20 mg | ORAL_TABLET | Freq: Four times a day (QID) | ORAL | 0 refills | Status: AC
Start: 2019-08-04 — End: 2019-08-06

## 2019-08-04 MED ORDER — RHO D IMMUNE GLOBULIN 1500 UNITS IM SOSY
300.00 ug | PREFILLED_SYRINGE | Freq: Once | INTRAMUSCULAR | Status: AC
Start: 2019-08-04 — End: 2019-08-04
  Administered 2019-08-04: 19:00:00 300 ug via INTRAMUSCULAR
  Filled 2019-08-04 (×2): qty 300

## 2019-08-04 MED ORDER — SODIUM CHLORIDE 0.9 % IV BOLUS
1000.00 mL | Freq: Once | INTRAVENOUS | Status: AC
Start: 2019-08-04 — End: 2019-08-04
  Administered 2019-08-04: 18:00:00 1000 mL via INTRAVENOUS

## 2019-08-04 MED ORDER — KETOROLAC TROMETHAMINE 30 MG/ML IJ SOLN
30.00 mg | Freq: Once | INTRAMUSCULAR | Status: AC
Start: 2019-08-04 — End: 2019-08-04
  Administered 2019-08-04: 18:00:00 30 mg via INTRAVENOUS
  Filled 2019-08-04: qty 1

## 2019-08-04 NOTE — ED Notes (Signed)
MD at bedside. 

## 2019-08-04 NOTE — ED Notes (Signed)
Bed: E05  Expected date:   Expected time:   Means of arrival:   Comments:

## 2019-08-04 NOTE — ED Notes (Signed)
NP at bedside.

## 2019-08-06 ENCOUNTER — Encounter: Payer: Self-pay | Admitting: Family Nurse Practitioner

## 2019-08-06 NOTE — ED Provider Notes (Signed)
History     Chief Complaint   Patient presents with    Vaginal Bleeding    Abdominal Pain     Pt is an otherwise healthy 23 year old female presenting w/ complaint of worsening lower abd pain and vaginal bleeding. Pt had an D&C  abortion performed 2 days ago, and a copper IUD inserted at time of D&C. She reports she had mild/moderate cramping and bleeding for the first 2 days after procedure, but it acutely worsened earlier today while trying to urinate. She reports associated clots, about the size of a quarter. She denies dysuria, vaginal pain, fevers, chills, n/v/d, dizziness, lightheadedness. She denies any significant PMH/PSH.     The history is provided by the patient. No language interpreter was used.   Vaginal Bleeding  Quality:  Bright red, clots and heavier than menses  Severity:  Severe  Onset quality:  Gradual  Duration:  2 days  Timing:  Constant  Progression:  Worsening  Chronicity:  New  Menstrual history:  Regular  Possible pregnancy: abortion 2 days ago.    Context: after urination    Relieved by:  Nothing  Worsened by:  Nothing  Associated symptoms: abdominal pain    Associated symptoms: no back pain, no dizziness, no dyspareunia, no dysuria, no fatigue, no fever, no nausea and no vaginal discharge    Risk factors: terminated pregnancy    Risk factors: no bleeding disorder, no hx of ectopic pregnancy, no hx of endometriosis, no gynecological surgery, does not have multiple partners, no new sexual partner, no ovarian cysts, no ovarian torsion, no PID, no STD and no STD exposure    Abdominal Pain  Associated symptoms: vaginal bleeding    Associated symptoms: no chest pain, no chills, no constipation, no cough, no diarrhea, no dysuria, no fatigue, no fever, no hematuria, no nausea, no shortness of breath, no sore throat, no vaginal discharge and no vomiting         No past medical history on file.    Past Surgical History:   Procedure Laterality Date    WISDOM TOOTH EXTRACTION         No  family history on file.    Social  Social History     Tobacco Use    Smoking status: Never Smoker    Smokeless tobacco: Never Used   Substance Use Topics    Alcohol use: Not on file    Drug use: Not on file       .     Allergies   Allergen Reactions    Gluten Meal     Nuts [Peanut Oil]     Onion     Soybean Oil        Home Medications     Med List Status: In Progress Set By: Sharen Hones, RN at 08/04/2019  5:12 PM                valacyclovir (VALTREX) 1000 MG tablet     Take 1,000 mg by mouth 2 (two) times daily           Review of Systems   Constitutional: Negative for appetite change, chills, diaphoresis, fatigue and fever.   HENT: Negative for congestion, rhinorrhea and sore throat.    Respiratory: Negative for cough and shortness of breath.    Cardiovascular: Negative for chest pain and leg swelling.   Gastrointestinal: Positive for abdominal pain. Negative for abdominal distention, blood in stool, constipation, diarrhea, nausea and vomiting.  Genitourinary: Positive for vaginal bleeding. Negative for difficulty urinating, dyspareunia, dysuria, flank pain, frequency, hematuria and vaginal discharge.   Musculoskeletal: Negative for arthralgias, back pain, myalgias and neck pain.   Skin: Negative for rash.   Neurological: Negative for dizziness, light-headedness and headaches.   Hematological: Negative for adenopathy.   Psychiatric/Behavioral: Negative for confusion and sleep disturbance. The patient is not nervous/anxious.        Physical Exam    BP: 124/67, Heart Rate: 86, Temp: 98 F (36.7 C), Resp Rate: 18, SpO2: 100 %, Weight: 59.2 kg    Physical Exam  Vitals signs and nursing note reviewed.   Constitutional:       General: She is not in acute distress.     Appearance: She is well-developed and well-nourished.      Comments: Pt alert, oriented x3, nontoxic appearing, in NAD   HENT:      Head: Normocephalic and atraumatic.      Mouth/Throat:      Mouth: Mucous membranes are normal. Mucous membranes  are not pale.   Eyes:      Extraocular Movements: EOM normal.      Conjunctiva/sclera: Conjunctivae normal.      Pupils: Pupils are equal, round, and reactive to light.   Neck:      Musculoskeletal: Normal range of motion and neck supple.   Cardiovascular:      Rate and Rhythm: Normal rate and regular rhythm.      Pulses: Intact distal pulses.      Heart sounds: Normal heart sounds.   Pulmonary:      Effort: Pulmonary effort is normal.      Breath sounds: Normal breath sounds.   Abdominal:      General: Bowel sounds are normal. There is no distension.      Palpations: Abdomen is soft. There is no mass.      Tenderness: There is no abdominal tenderness. There is no guarding or rebound.   Genitourinary:     Labia:         Right: No tenderness or injury.         Left: No tenderness or injury.       Vagina: No signs of injury. Bleeding present. No vaginal discharge or tenderness.      Cervix: No cervical motion tenderness, discharge or friability.      Comments: RN at bedside as chaperone  +bright red blood w/ small clots, smaller than a quarter, noted in vaginal canal  Cervical os slightly dilated  No brisk bleeding noted  No lacerations or tissue noted  Lymphadenopathy:      Cervical: No cervical adenopathy.   Skin:     General: Skin is warm and dry.   Neurological:      Mental Status: She is alert and oriented to person, place, and time.       Physical Exam   Constitutional: She is oriented to person, place, and time. She appears well-developed and well-nourished. No distress.   Pt alert, oriented x3, nontoxic appearing, in NAD   HENT:   Head: Normocephalic and atraumatic.   Mouth/Throat: Mucous membranes are normal. Mucous membranes are not pale.   Eyes: Pupils are equal, round, and reactive to light. Conjunctivae and EOM are normal.   Neck: Normal range of motion. Neck supple.   Cardiovascular: Normal rate, regular rhythm, normal heart sounds and intact distal pulses.   Pulmonary/Chest: Effort normal and breath  sounds normal.   Abdominal: Soft. Bowel sounds  are normal. She exhibits no distension and no mass. There is no abdominal tenderness. There is no rebound and no guarding.   Genitourinary: There is no tenderness or injury on the right labia. There is no tenderness or injury on the left labia. Cervix exhibits no motion tenderness, no discharge and no friability.    Vaginal bleeding present.      No vaginal discharge or tenderness.   There is bleeding in the vagina. No tenderness in the vagina.    No signs of injury in the vagina.      Genitourinary Comments: RN at bedside as chaperone  +bright red blood w/ small clots, smaller than a quarter, noted in vaginal canal  Cervical os slightly dilated  No brisk bleeding noted  No lacerations or tissue noted     Lymphadenopathy:     She has no cervical adenopathy.   Neurological: She is alert and oriented to person, place, and time.   Skin: Skin is warm and dry.   Nursing note and vitals reviewed.      MDM and ED Course     ED Medication Orders (From admission, onward)    Start Ordered     Status Ordering Provider    08/04/19 1832 08/04/19 1833  rho(D) immune globulin (RHOGAM) injection (OB only) 300 mcg  Once     Route: Intramuscular  Ordered Dose: 300 mcg     Last MAR action: Given Andreia Gandolfi C    08/04/19 1723 08/04/19 1722  sodium chloride 0.9 % bolus 1,000 mL  Once     Route: Intravenous  Ordered Dose: 1,000 mL     Last MAR action: Stopped Doyal Saric C    08/04/19 1723 08/04/19 1722  ketorolac (TORADOL) injection 30 mg  Once     Route: Intravenous  Ordered Dose: 30 mg     Last MAR action: Given Eliezer Khawaja C             MDM  I am the first provider for this patient.  I reviewed the vital signs, nursing notes, past medical history, past surgical history, family history and social history.  I have reviewed the patient's previous charts.    The patient is NOT septic.  All labs and vital signs from the current visit have been   reviewed and any abnormality that  is present is not due to sepsis.    This note was generated by the Epic EMR system/ Dragon speech recognition and may contain inherent errors or omissions not intended by the user. Grammatical errors, random word insertions, deletions and pronoun errors  are occasional consequences of this technology due to software limitations. Not all errors are caught or corrected. If there are questions or concerns about the content of this note or information contained within the body of this dictation they should be addressed directly with the author for clarification.    Pt is an otherwise healthy 23 year old female w/ recent D&C presenting w/ worsening abd pain and bleeding. bloodwork unremarkable. Given Rhogam due to negative Rh factor.  US unremarkable. Pt hemodynamically stable w/ stable H&H. Denies dizziness/ lightheadedness.  Discussed w./ GYN Dr. Tenna Delaine, recommends Methergen PO and follow up in his office.  Discussed findings and plan w/ pt. Agreeable to plan. Answered all questions.  Diff dx including hypovolemic shock, sepsis, uterine perforation, GU infection, retained products of conception, vascular injury have been considered and rejected based on clinical presentation. No serious etiology suspected at this time. Pt  stable to go home. Given strict ER return precautions.      ED Course as of Aug 05 1008   Mon Aug 04, 2019   1758 WNL   Comprehensive metabolic panel(!) [OC]   1810 ABO/Rh [OC]   1815 Discussed case w/ GYN on call Dr. Tenna Delaine. States pt's [place] likely reflects blood/ clots, low concern for retained products. Recommends Rhogam in ED. D/c w/ Rx for Methergen 0.2 PO q6h x3 days. F/u in office in 2 days.    [OC]   Wed Aug 06, 2019   1003 neg   Antibody Screen [OC]   1003 Negative Rh; will administer Rhogam    ABO/Rh [OC]   1003 Consistent w/ recently terminated pregnancy   Beta HCG, Quant, Serum [OC]   1003 Unremarkable; stable H&H   CBC and differential(!) [OC]   1003 WNL   Comprehensive metabolic  panel(!) [OC]   1004 Transvaginal US personally visualized and interpreted by myself. Radiologist report also reviewed. +blood/ blood clots noted in uterus. No retained products/ perforation noted. Ovaries WNL.    [OC]      ED Course User Index  [OC] Jimmie Molly, FNP             Procedures    Clinical Impression & Disposition     Clinical Impression  Final diagnoses:   Vaginal bleeding   Status post D&C   Need for rhogam due to Rh negative mother        ED Disposition     ED Disposition Condition Date/Time Comment    Discharge  Mon Aug 04, 2019  7:00 PM Joy Vance discharge to home/self care.    Condition at disposition: Stable             Discharge Medication List as of 08/04/2019  7:01 PM      START taking these medications    Details   methylergonovine (Methergine) 0.2 MG tablet Take 1 tablet (0.2 mg total) by mouth every 6 (six) hours for 2 days, Starting Mon 08/04/2019, Until Wed 08/06/2019, Print                       Jimmie Molly, [place]  08/06/19 1009
